# Patient Record
Sex: Male | Born: 1985 | Hispanic: No | Marital: Single | Smoking: Current every day smoker
Health system: Southern US, Community
[De-identification: ages and names within clinical notes are randomized; demographics above are authoritative.]

---

## 2010-09-11 ENCOUNTER — Emergency Department (HOSPITAL_COMMUNITY)
Admission: EM | Admit: 2010-09-11 | Discharge: 2010-09-11 | Disposition: A | Discharge status: Home / Self Care | Payer: Self-pay | Attending: Emergency Medicine | Admitting: Emergency Medicine

## 2010-09-11 DIAGNOSIS — F1210 Cannabis abuse, uncomplicated: Secondary | ICD-10-CM | POA: Insufficient documentation

## 2010-09-11 DIAGNOSIS — R110 Nausea: Secondary | ICD-10-CM | POA: Insufficient documentation

## 2010-09-11 DIAGNOSIS — T391X1A Poisoning by 4-Aminophenol derivatives, accidental (unintentional), initial encounter: Secondary | ICD-10-CM | POA: Insufficient documentation

## 2010-09-11 DIAGNOSIS — R000 Tachycardia, unspecified: Secondary | ICD-10-CM | POA: Insufficient documentation

## 2010-09-11 DIAGNOSIS — T398X2A Poisoning by other nonopioid analgesics and antipyretics, not elsewhere classified, intentional self-harm, initial encounter: Secondary | ICD-10-CM | POA: Insufficient documentation

## 2010-09-11 DIAGNOSIS — Z139 Encounter for screening, unspecified: Secondary | ICD-10-CM | POA: Insufficient documentation

## 2010-09-11 DIAGNOSIS — T394X2A Poisoning by antirheumatics, not elsewhere classified, intentional self-harm, initial encounter: Secondary | ICD-10-CM | POA: Insufficient documentation

## 2010-09-11 DIAGNOSIS — F17200 Nicotine dependence, unspecified, uncomplicated: Secondary | ICD-10-CM | POA: Insufficient documentation

## 2010-09-11 LAB — RAPID URINE DRUG SCREEN, HOSP PERFORMED
Amphetamines: NOT DETECTED
Barbiturates: NOT DETECTED
Benzodiazepines: NOT DETECTED
Cocaine: NOT DETECTED
Opiates: NOT DETECTED
Tetrahydrocannabinol: POSITIVE — AB

## 2010-09-11 LAB — DIFFERENTIAL
Basophils Absolute: 0.1 K/uL (ref 0.0–0.1)
Basophils Relative: 1 % (ref 0–1)
Eosinophils Absolute: 0.3 K/uL (ref 0.0–0.7)
Eosinophils Relative: 3 % (ref 0–5)
Lymphocytes Relative: 38 % (ref 12–46)
Lymphs Abs: 3.5 10*3/uL (ref 0.7–4.0)
Monocytes Absolute: 1 K/uL (ref 0.1–1.0)
Monocytes Relative: 11 % (ref 3–12)
Neutro Abs: 4.4 10*3/uL (ref 1.7–7.7)
Neutrophils Relative %: 47 % (ref 43–77)

## 2010-09-11 LAB — CBC
HCT: 40.2 % (ref 39.0–52.0)
Hemoglobin: 13.1 g/dL (ref 13.0–17.0)
MCH: 20 pg — ABNORMAL LOW (ref 26.0–34.0)
MCHC: 32.6 g/dL (ref 30.0–36.0)
MCV: 61.5 fL — ABNORMAL LOW (ref 78.0–100.0)
Platelets: 203 10*3/uL (ref 150–400)
RBC: 6.54 MIL/uL — ABNORMAL HIGH (ref 4.22–5.81)
RDW: 16.7 % — ABNORMAL HIGH (ref 11.5–15.5)
WBC: 9.3 10*3/uL (ref 4.0–10.5)

## 2010-09-11 LAB — SALICYLATE LEVEL: Salicylate Lvl: <4 mg/dL (ref 2.8–20.0)

## 2010-09-11 LAB — BASIC METABOLIC PANEL
CO2: 27 mEq/L (ref 19–32)
Calcium: 9.5 mg/dL (ref 8.4–10.5)
Chloride: 105 mEq/L (ref 96–112)
GFR calc Af Amer: >60 mL/min (ref >60)
Potassium: 3.9 mEq/L (ref 3.5–5.1)
Sodium: 139 mEq/L (ref 135–145)

## 2010-09-11 LAB — BASIC METABOLIC PANEL WITH GFR
BUN: 11 mg/dL (ref 6–23)
Creatinine, Ser: 0.81 mg/dL (ref 0.4–1.5)
GFR calc non Af Amer: >60 mL/min (ref >60)
Glucose, Bld: 89 mg/dL (ref 70–99)

## 2010-09-11 LAB — ACETAMINOPHEN LEVEL: Acetaminophen (Tylenol), Serum: <10 ug/mL — ABNORMAL LOW (ref 10–30)

## 2010-09-11 LAB — ETHANOL: Alcohol, Ethyl (B): <5 mg/dL (ref 0–10)

## 2011-03-20 ENCOUNTER — Emergency Department (HOSPITAL_COMMUNITY): Payer: Self-pay

## 2011-03-20 ENCOUNTER — Inpatient Hospital Stay (HOSPITAL_COMMUNITY)
Admission: EM | Admit: 2011-03-20 | Discharge: 2011-03-28 | DRG: 956 | Disposition: A | Discharge status: Home Health | Payer: No Typology Code available for payment source | Attending: General Surgery | Admitting: General Surgery

## 2011-03-20 ENCOUNTER — Emergency Department (HOSPITAL_COMMUNITY): Payer: No Typology Code available for payment source

## 2011-03-20 ENCOUNTER — Encounter (HOSPITAL_COMMUNITY): Payer: Self-pay | Admitting: Radiology

## 2011-03-20 DIAGNOSIS — F1210 Cannabis abuse, uncomplicated: Secondary | ICD-10-CM | POA: Diagnosis present

## 2011-03-20 DIAGNOSIS — S62639A Displaced fracture of distal phalanx of unspecified finger, initial encounter for closed fracture: Secondary | ICD-10-CM | POA: Diagnosis present

## 2011-03-20 DIAGNOSIS — F17200 Nicotine dependence, unspecified, uncomplicated: Secondary | ICD-10-CM | POA: Diagnosis present

## 2011-03-20 DIAGNOSIS — S62109A Fracture of unspecified carpal bone, unspecified wrist, initial encounter for closed fracture: Secondary | ICD-10-CM

## 2011-03-20 DIAGNOSIS — S32409A Unspecified fracture of unspecified acetabulum, initial encounter for closed fracture: Secondary | ICD-10-CM | POA: Diagnosis present

## 2011-03-20 DIAGNOSIS — M549 Dorsalgia, unspecified: Secondary | ICD-10-CM | POA: Diagnosis present

## 2011-03-20 DIAGNOSIS — L989 Disorder of the skin and subcutaneous tissue, unspecified: Secondary | ICD-10-CM | POA: Diagnosis present

## 2011-03-20 DIAGNOSIS — E876 Hypokalemia: Secondary | ICD-10-CM | POA: Diagnosis not present

## 2011-03-20 DIAGNOSIS — M25579 Pain in unspecified ankle and joints of unspecified foot: Secondary | ICD-10-CM | POA: Diagnosis present

## 2011-03-20 DIAGNOSIS — Y998 Other external cause status: Secondary | ICD-10-CM

## 2011-03-20 DIAGNOSIS — R339 Retention of urine, unspecified: Secondary | ICD-10-CM | POA: Diagnosis not present

## 2011-03-20 DIAGNOSIS — S060X9A Concussion with loss of consciousness of unspecified duration, initial encounter: Secondary | ICD-10-CM | POA: Diagnosis present

## 2011-03-20 DIAGNOSIS — M25569 Pain in unspecified knee: Secondary | ICD-10-CM | POA: Diagnosis present

## 2011-03-20 DIAGNOSIS — D62 Acute posthemorrhagic anemia: Secondary | ICD-10-CM | POA: Diagnosis present

## 2011-03-20 DIAGNOSIS — Y92410 Unspecified street and highway as the place of occurrence of the external cause: Secondary | ICD-10-CM

## 2011-03-20 DIAGNOSIS — R079 Chest pain, unspecified: Secondary | ICD-10-CM | POA: Diagnosis present

## 2011-03-20 DIAGNOSIS — S62113A Displaced fracture of triquetrum [cuneiform] bone, unspecified wrist, initial encounter for closed fracture: Secondary | ICD-10-CM | POA: Diagnosis present

## 2011-03-20 DIAGNOSIS — S72033A Displaced midcervical fracture of unspecified femur, initial encounter for closed fracture: Principal | ICD-10-CM | POA: Diagnosis present

## 2011-03-20 LAB — CBC
HCT: 36.4 % — ABNORMAL LOW (ref 39.0–52.0)
Hemoglobin: 11.7 g/dL — ABNORMAL LOW (ref 13.0–17.0)
MCH: 19.6 pg — ABNORMAL LOW (ref 26.0–34.0)
MCHC: 32.1 g/dL (ref 30.0–36.0)
Platelets: 175 10*3/uL (ref 150–400)
RBC: 5.68 MIL/uL (ref 4.22–5.81)
WBC: 20.3 10*3/uL — ABNORMAL HIGH (ref 4.0–10.5)

## 2011-03-20 LAB — COMPREHENSIVE METABOLIC PANEL
ALT: 64 U/L — ABNORMAL HIGH (ref 0–53)
Calcium: 8.7 mg/dL (ref 8.4–10.5)
GFR calc Af Amer: >60 mL/min (ref >60)
Glucose, Bld: 126 mg/dL — ABNORMAL HIGH (ref 70–99)
Sodium: 137 mEq/L (ref 135–145)
Total Protein: 6.4 g/dL (ref 6.0–8.3)

## 2011-03-20 LAB — HEMOGLOBIN AND HEMATOCRIT, BLOOD: Hemoglobin: 11.1 g/dL — ABNORMAL LOW (ref 13.0–17.0)

## 2011-03-20 LAB — GLUCOSE, CAPILLARY: Glucose-Capillary: 112 mg/dL — ABNORMAL HIGH (ref 70–99)

## 2011-03-20 LAB — POCT I-STAT, CHEM 8
Creatinine, Ser: 0.7 mg/dL (ref 0.50–1.35)
Glucose, Bld: 121 mg/dL — ABNORMAL HIGH (ref 70–99)
Hemoglobin: 13.6 g/dL (ref 13.0–17.0)
Potassium: 3.8 mEq/L (ref 3.5–5.1)

## 2011-03-20 LAB — PROTIME-INR: Prothrombin Time: 14.1 seconds (ref 11.6–15.2)

## 2011-03-20 MED ORDER — IOHEXOL 300 MG/ML  SOLN
100.0000 mL | Freq: Once | INTRAMUSCULAR | Status: AC | PRN
  Administered 2011-03-20: 100 mL via INTRAVENOUS

## 2011-03-21 ENCOUNTER — Inpatient Hospital Stay (HOSPITAL_COMMUNITY): Payer: No Typology Code available for payment source

## 2011-03-21 LAB — BASIC METABOLIC PANEL
BUN: 6 mg/dL (ref 6–23)
CO2: 28 mEq/L (ref 19–32)
Chloride: 101 mEq/L (ref 96–112)
Creatinine, Ser: 0.51 mg/dL (ref 0.50–1.35)

## 2011-03-21 LAB — RAPID URINE DRUG SCREEN, HOSP PERFORMED
Cocaine: NOT DETECTED
Opiates: NOT DETECTED

## 2011-03-21 LAB — CBC
HCT: 30.6 % — ABNORMAL LOW (ref 39.0–52.0)
Hemoglobin: 9.9 g/dL — ABNORMAL LOW (ref 13.0–17.0)
MCV: 61.3 fL — ABNORMAL LOW (ref 78.0–100.0)
RBC: 4.99 MIL/uL (ref 4.22–5.81)
WBC: 15.2 10*3/uL — ABNORMAL HIGH (ref 4.0–10.5)

## 2011-03-21 LAB — HEMOGLOBIN AND HEMATOCRIT, BLOOD
HCT: 28.2 % — ABNORMAL LOW (ref 39.0–52.0)
Hemoglobin: 9.7 g/dL — ABNORMAL LOW (ref 13.0–17.0)

## 2011-03-22 ENCOUNTER — Inpatient Hospital Stay (HOSPITAL_COMMUNITY): Payer: No Typology Code available for payment source

## 2011-03-22 HISTORY — PX: HIP SURGERY: SHX245

## 2011-03-22 LAB — BASIC METABOLIC PANEL
BUN: 13 mg/dL (ref 6–23)
Chloride: 102 mEq/L (ref 96–112)
GFR calc Af Amer: >90 mL/min (ref >90)
Potassium: 3.6 mEq/L (ref 3.5–5.1)

## 2011-03-22 LAB — CBC
HCT: 24.9 % — ABNORMAL LOW (ref 39.0–52.0)
HCT: 35 % — ABNORMAL LOW (ref 39.0–52.0)
Hemoglobin: 8.1 g/dL — ABNORMAL LOW (ref 13.0–17.0)
Hemoglobin: 12.2 g/dL — ABNORMAL LOW (ref 13.0–17.0)
MCH: 25.3 pg — ABNORMAL LOW (ref 26.0–34.0)
MCHC: 34.9 g/dL (ref 30.0–36.0)
RDW: 16.6 % — ABNORMAL HIGH (ref 11.5–15.5)
RDW: 23.1 % — ABNORMAL HIGH (ref 11.5–15.5)
WBC: 12.3 10*3/uL — ABNORMAL HIGH (ref 4.0–10.5)

## 2011-03-23 LAB — PREPARE FRESH FROZEN PLASMA: Unit division: 0

## 2011-03-23 LAB — BASIC METABOLIC PANEL
BUN: 7 mg/dL (ref 6–23)
CO2: 31 mEq/L (ref 19–32)
Calcium: 7.6 mg/dL — ABNORMAL LOW (ref 8.4–10.5)
Creatinine, Ser: 0.5 mg/dL (ref 0.50–1.35)
Glucose, Bld: 193 mg/dL — ABNORMAL HIGH (ref 70–99)

## 2011-03-23 LAB — CBC
MCH: 25.3 pg — ABNORMAL LOW (ref 26.0–34.0)
MCHC: 35.2 g/dL (ref 30.0–36.0)
MCV: 71.9 fL — ABNORMAL LOW (ref 78.0–100.0)
Platelets: 94 10*3/uL — ABNORMAL LOW (ref 150–400)
RDW: 22.5 % — ABNORMAL HIGH (ref 11.5–15.5)

## 2011-03-23 LAB — PROTIME-INR: Prothrombin Time: 16.2 seconds — ABNORMAL HIGH (ref 11.6–15.2)
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
## 2011-03-24 DIAGNOSIS — S72023A Displaced fracture of epiphysis (separation) (upper) of unspecified femur, initial encounter for closed fracture: Secondary | ICD-10-CM

## 2011-03-24 DIAGNOSIS — S92919A Unspecified fracture of unspecified toe(s), initial encounter for closed fracture: Secondary | ICD-10-CM

## 2011-03-24 DIAGNOSIS — S32409A Unspecified fracture of unspecified acetabulum, initial encounter for closed fracture: Secondary | ICD-10-CM

## 2011-03-24 DIAGNOSIS — S62109A Fracture of unspecified carpal bone, unspecified wrist, initial encounter for closed fracture: Secondary | ICD-10-CM

## 2011-03-24 LAB — MAGNESIUM: Magnesium: 1.9 mg/dL (ref 1.5–2.5)

## 2011-03-24 LAB — CBC
MCH: 25.1 pg — ABNORMAL LOW (ref 26.0–34.0)
MCHC: 34.7 g/dL (ref 30.0–36.0)
MCV: 72.5 fL — ABNORMAL LOW (ref 78.0–100.0)
Platelets: 135 10*3/uL — ABNORMAL LOW (ref 150–400)
RBC: 3.74 MIL/uL — ABNORMAL LOW (ref 4.22–5.81)
RDW: 23.6 % — ABNORMAL HIGH (ref 11.5–15.5)

## 2011-03-24 LAB — BASIC METABOLIC PANEL
CO2: 30 mEq/L (ref 19–32)
Calcium: 8.1 mg/dL — ABNORMAL LOW (ref 8.4–10.5)
Creatinine, Ser: 0.52 mg/dL (ref 0.50–1.35)
GFR calc non Af Amer: >90 mL/min (ref >90)

## 2011-03-24 LAB — POCT I-STAT 4, (NA,K, GLUC, HGB,HCT)
Glucose, Bld: 105 mg/dL — ABNORMAL HIGH (ref 70–99)
HCT: 25 % — ABNORMAL LOW (ref 39.0–52.0)
Hemoglobin: 8.8 g/dL — ABNORMAL LOW (ref 13.0–17.0)
Hemoglobin: 8.5 g/dL — ABNORMAL LOW (ref 13.0–17.0)
Potassium: 4.4 meq/L (ref 3.5–5.1)
Sodium: 134 meq/L — ABNORMAL LOW (ref 135–145)

## 2011-03-25 LAB — CARDIAC PANEL(CRET KIN+CKTOT+MB+TROPI)
CK, MB: 1.5 ng/mL (ref 0.3–4.0)
Total CK: 371 U/L — ABNORMAL HIGH (ref 7–232)

## 2011-03-25 LAB — PROTIME-INR: Prothrombin Time: 14.5 seconds (ref 11.6–15.2)

## 2011-03-26 LAB — CROSSMATCH
ABO/RH(D): B POS
Antibody Screen: NEGATIVE
Unit division: 0
Unit division: 0
Unit division: 0
Unit division: 0
Unit division: 0

## 2011-03-26 LAB — CBC
MCH: 24.9 pg — ABNORMAL LOW (ref 26.0–34.0)
MCV: 73 fL — ABNORMAL LOW (ref 78.0–100.0)
Platelets: 182 10*3/uL (ref 150–400)
WBC: 7.5 10*3/uL (ref 4.0–10.5)
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
## 2011-05-03 ENCOUNTER — Emergency Department (HOSPITAL_COMMUNITY): Payer: No Typology Code available for payment source

## 2011-05-03 ENCOUNTER — Encounter (HOSPITAL_COMMUNITY): Payer: Self-pay

## 2011-05-03 ENCOUNTER — Emergency Department (HOSPITAL_COMMUNITY)
Admission: EM | Admit: 2011-05-03 | Discharge: 2011-05-04 | Disposition: A | Discharge status: Home / Self Care | Payer: No Typology Code available for payment source | Attending: Emergency Medicine | Admitting: Emergency Medicine

## 2011-05-03 DIAGNOSIS — S79929A Unspecified injury of unspecified thigh, initial encounter: Secondary | ICD-10-CM | POA: Insufficient documentation

## 2011-05-03 DIAGNOSIS — S8990XA Unspecified injury of unspecified lower leg, initial encounter: Secondary | ICD-10-CM | POA: Insufficient documentation

## 2011-05-03 DIAGNOSIS — S79919A Unspecified injury of unspecified hip, initial encounter: Secondary | ICD-10-CM | POA: Insufficient documentation

## 2011-05-03 DIAGNOSIS — S99929A Unspecified injury of unspecified foot, initial encounter: Secondary | ICD-10-CM | POA: Insufficient documentation

## 2011-05-03 DIAGNOSIS — W010XXA Fall on same level from slipping, tripping and stumbling without subsequent striking against object, initial encounter: Secondary | ICD-10-CM | POA: Insufficient documentation

## 2011-05-03 DIAGNOSIS — M25559 Pain in unspecified hip: Secondary | ICD-10-CM | POA: Insufficient documentation

## 2011-05-03 DIAGNOSIS — M79609 Pain in unspecified limb: Secondary | ICD-10-CM | POA: Insufficient documentation

## 2011-05-03 MED ORDER — HYDROMORPHONE HCL PF 2 MG/ML IJ SOLN
2.0000 mg | Freq: Once | INTRAMUSCULAR | Status: AC
  Administered 2011-05-03: 2 mg via INTRAMUSCULAR
  Filled 2011-05-03: qty 1

## 2011-05-03 MED ORDER — HYDROMORPHONE HCL PF 2 MG/ML IJ SOLN: 2.0000 mg | Freq: Once | INTRAMUSCULAR | Status: DC

## 2011-05-03 NOTE — ED Notes (Signed)
Patient presents with left hip pain today after tripping and falling. Patient had recent left hip surgery 1 month ago.  Patient reports he cannot put weight on left leg.  It's hard to tell if shortening is present because patient will not move his leg.  Skin is intact. Left ankle swelling noted, patient moves ankle and foot without difficulty.
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
## 2011-05-03 NOTE — ED Notes (Signed)
PT to xray

## 2011-05-04 MED ORDER — HYDROMORPHONE HCL 4 MG PO TABS: 4.0000 mg | ORAL_TABLET | ORAL | Status: AC | PRN

## 2011-06-16 ENCOUNTER — Ambulatory Visit: Payer: No Typology Code available for payment source | Attending: Orthopedic Surgery | Admitting: Physical Therapy

## 2011-06-16 DIAGNOSIS — M25673 Stiffness of unspecified ankle, not elsewhere classified: Secondary | ICD-10-CM | POA: Insufficient documentation

## 2011-06-16 DIAGNOSIS — M25659 Stiffness of unspecified hip, not elsewhere classified: Secondary | ICD-10-CM | POA: Insufficient documentation

## 2011-06-16 DIAGNOSIS — IMO0001 Reserved for inherently not codable concepts without codable children: Secondary | ICD-10-CM | POA: Insufficient documentation

## 2011-06-16 DIAGNOSIS — M25676 Stiffness of unspecified foot, not elsewhere classified: Secondary | ICD-10-CM | POA: Insufficient documentation

## 2011-06-16 DIAGNOSIS — R262 Difficulty in walking, not elsewhere classified: Secondary | ICD-10-CM | POA: Insufficient documentation

## 2011-06-16 DIAGNOSIS — M6281 Muscle weakness (generalized): Secondary | ICD-10-CM | POA: Insufficient documentation

## 2011-06-24 ENCOUNTER — Ambulatory Visit: Payer: No Typology Code available for payment source | Attending: Orthopedic Surgery | Admitting: Rehabilitation

## 2011-06-24 DIAGNOSIS — M25676 Stiffness of unspecified foot, not elsewhere classified: Secondary | ICD-10-CM | POA: Insufficient documentation

## 2011-06-24 DIAGNOSIS — M6281 Muscle weakness (generalized): Secondary | ICD-10-CM | POA: Insufficient documentation

## 2011-06-24 DIAGNOSIS — M25659 Stiffness of unspecified hip, not elsewhere classified: Secondary | ICD-10-CM | POA: Insufficient documentation

## 2011-06-24 DIAGNOSIS — M25673 Stiffness of unspecified ankle, not elsewhere classified: Secondary | ICD-10-CM | POA: Insufficient documentation

## 2011-06-24 DIAGNOSIS — R262 Difficulty in walking, not elsewhere classified: Secondary | ICD-10-CM | POA: Insufficient documentation

## 2011-06-24 DIAGNOSIS — IMO0001 Reserved for inherently not codable concepts without codable children: Secondary | ICD-10-CM | POA: Insufficient documentation

## 2011-06-28 ENCOUNTER — Ambulatory Visit: Payer: No Typology Code available for payment source | Admitting: Physical Therapy

## 2011-06-30 ENCOUNTER — Ambulatory Visit: Payer: No Typology Code available for payment source | Admitting: Physical Therapy

## 2011-07-05 ENCOUNTER — Ambulatory Visit: Payer: No Typology Code available for payment source | Admitting: Rehabilitation

## 2011-07-07 ENCOUNTER — Ambulatory Visit: Payer: No Typology Code available for payment source | Admitting: Physical Therapy

## 2011-07-12 ENCOUNTER — Encounter: Payer: No Typology Code available for payment source | Admitting: Rehabilitation

## 2011-07-14 ENCOUNTER — Ambulatory Visit: Payer: No Typology Code available for payment source | Admitting: Physical Therapy

## 2011-07-15 ENCOUNTER — Ambulatory Visit: Payer: No Typology Code available for payment source | Admitting: Physical Therapy

## 2011-07-20 ENCOUNTER — Ambulatory Visit: Payer: No Typology Code available for payment source | Admitting: Physical Therapy

## 2011-07-21 ENCOUNTER — Ambulatory Visit: Payer: No Typology Code available for payment source | Admitting: Physical Therapy

## 2011-07-23 ENCOUNTER — Ambulatory Visit: Payer: No Typology Code available for payment source | Attending: Orthopedic Surgery | Admitting: Physical Therapy

## 2011-07-23 DIAGNOSIS — IMO0001 Reserved for inherently not codable concepts without codable children: Secondary | ICD-10-CM | POA: Insufficient documentation

## 2011-07-23 DIAGNOSIS — M25676 Stiffness of unspecified foot, not elsewhere classified: Secondary | ICD-10-CM | POA: Insufficient documentation

## 2011-07-23 DIAGNOSIS — M6281 Muscle weakness (generalized): Secondary | ICD-10-CM | POA: Insufficient documentation

## 2011-07-23 DIAGNOSIS — M25659 Stiffness of unspecified hip, not elsewhere classified: Secondary | ICD-10-CM | POA: Insufficient documentation

## 2011-07-23 DIAGNOSIS — M25673 Stiffness of unspecified ankle, not elsewhere classified: Secondary | ICD-10-CM | POA: Insufficient documentation

## 2011-07-23 DIAGNOSIS — R262 Difficulty in walking, not elsewhere classified: Secondary | ICD-10-CM | POA: Insufficient documentation

## 2011-07-27 ENCOUNTER — Ambulatory Visit: Payer: No Typology Code available for payment source | Admitting: Physical Therapy

## 2011-07-28 ENCOUNTER — Ambulatory Visit: Payer: No Typology Code available for payment source | Admitting: Physical Therapy

## 2011-07-30 ENCOUNTER — Ambulatory Visit: Payer: No Typology Code available for payment source | Admitting: Physical Therapy

## 2011-08-04 ENCOUNTER — Ambulatory Visit: Payer: No Typology Code available for payment source | Admitting: Physical Therapy

## 2011-08-06 ENCOUNTER — Ambulatory Visit: Payer: No Typology Code available for payment source | Admitting: Rehabilitation

## 2011-08-09 ENCOUNTER — Ambulatory Visit: Payer: No Typology Code available for payment source | Admitting: Rehabilitation

## 2011-08-11 ENCOUNTER — Ambulatory Visit: Payer: No Typology Code available for payment source | Admitting: Physical Therapy

## 2011-08-13 ENCOUNTER — Ambulatory Visit: Payer: No Typology Code available for payment source | Admitting: Physical Therapy

## 2011-08-16 ENCOUNTER — Ambulatory Visit: Payer: No Typology Code available for payment source | Admitting: Rehabilitation

## 2011-08-19 ENCOUNTER — Ambulatory Visit: Payer: No Typology Code available for payment source | Admitting: Rehabilitation

## 2011-08-23 ENCOUNTER — Ambulatory Visit: Payer: No Typology Code available for payment source | Attending: Orthopedic Surgery | Admitting: Rehabilitation

## 2011-08-23 DIAGNOSIS — M25673 Stiffness of unspecified ankle, not elsewhere classified: Secondary | ICD-10-CM | POA: Insufficient documentation

## 2011-08-23 DIAGNOSIS — M25659 Stiffness of unspecified hip, not elsewhere classified: Secondary | ICD-10-CM | POA: Insufficient documentation

## 2011-08-23 DIAGNOSIS — M25676 Stiffness of unspecified foot, not elsewhere classified: Secondary | ICD-10-CM | POA: Insufficient documentation

## 2011-08-23 DIAGNOSIS — R262 Difficulty in walking, not elsewhere classified: Secondary | ICD-10-CM | POA: Insufficient documentation

## 2011-08-23 DIAGNOSIS — IMO0001 Reserved for inherently not codable concepts without codable children: Secondary | ICD-10-CM | POA: Insufficient documentation

## 2011-08-23 DIAGNOSIS — M6281 Muscle weakness (generalized): Secondary | ICD-10-CM | POA: Insufficient documentation

## 2011-08-24 ENCOUNTER — Ambulatory Visit
Admission: RE | Admit: 2011-08-24 | Discharge: 2011-08-24 | Disposition: A | Discharge status: Home / Self Care | Payer: Self-pay | Source: Ambulatory Visit | Attending: Orthopedic Surgery | Admitting: Orthopedic Surgery

## 2011-08-24 ENCOUNTER — Other Ambulatory Visit: Payer: Self-pay | Admitting: Orthopedic Surgery

## 2011-08-24 DIAGNOSIS — M2170 Unequal limb length (acquired), unspecified site: Secondary | ICD-10-CM

## 2011-08-25 ENCOUNTER — Encounter: Payer: No Typology Code available for payment source | Admitting: Rehabilitation

## 2011-08-30 ENCOUNTER — Encounter: Payer: No Typology Code available for payment source | Admitting: Rehabilitation

## 2011-09-01 ENCOUNTER — Encounter: Payer: No Typology Code available for payment source | Admitting: Physical Therapy

## 2011-09-08 ENCOUNTER — Ambulatory Visit: Payer: No Typology Code available for payment source | Admitting: Physical Therapy

## 2011-09-10 ENCOUNTER — Encounter: Payer: No Typology Code available for payment source | Admitting: Rehabilitation

## 2012-09-16 IMAGING — RF DG HIP (WITH OR WITHOUT PELVIS) 2-3V*L*
1 series · 5 of 5 positions shown · non-contrast
Comparison: 03/20/2011.

CLINICAL DATA: Left acetabular ORIF.

LEFT HIP - COMPLETE 2+ VIEW

[Series 1: run · 5 of 5 slices shown]
[im 1/5]
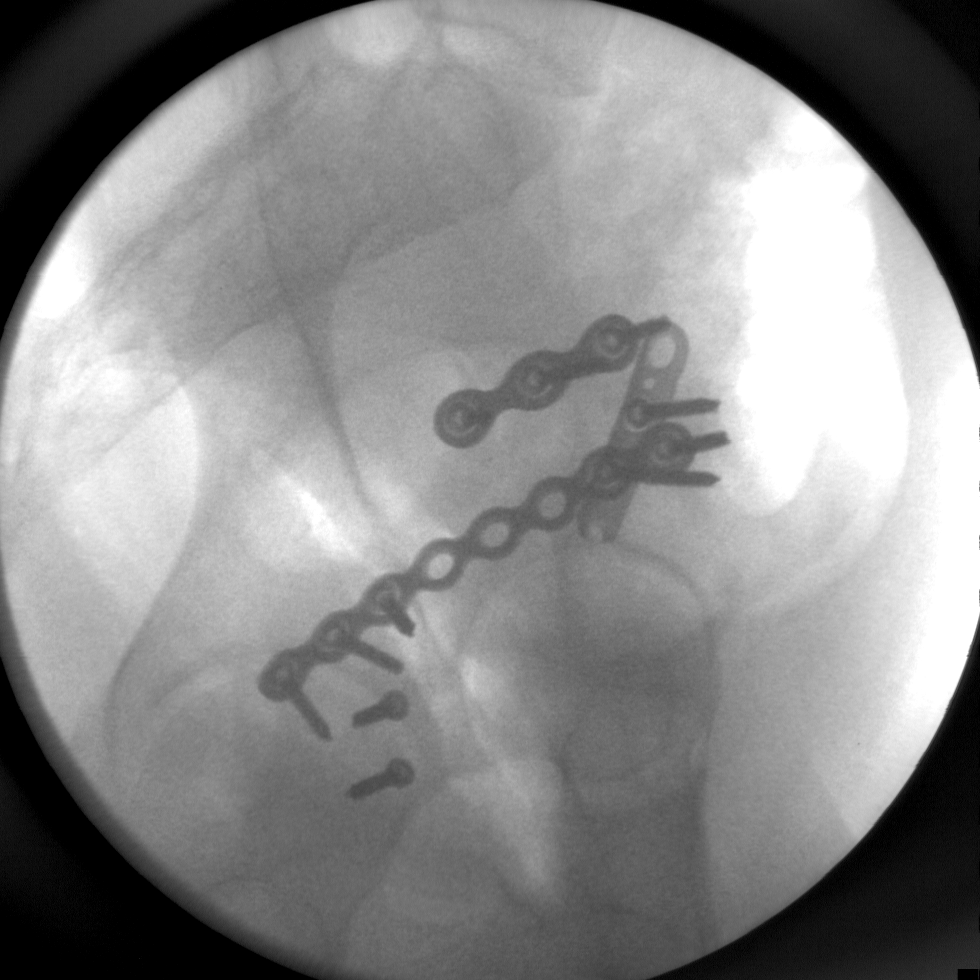
[im 2/5]
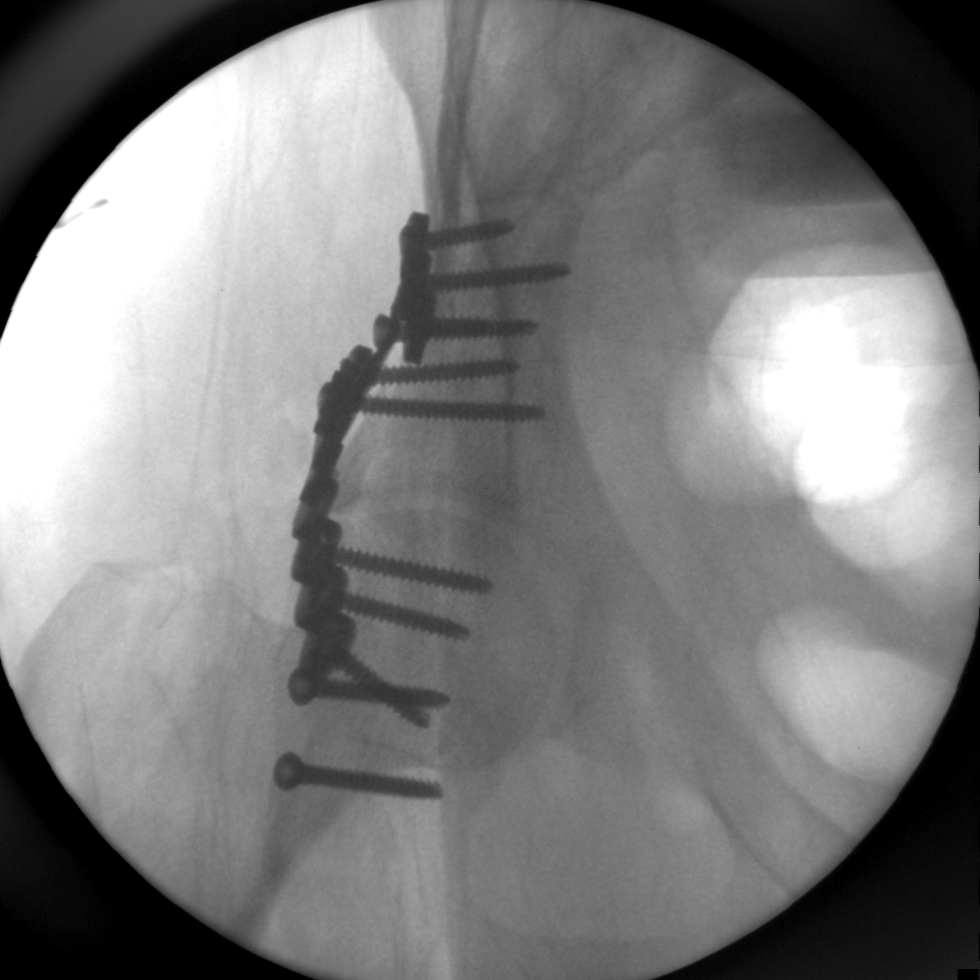
[im 3/5]
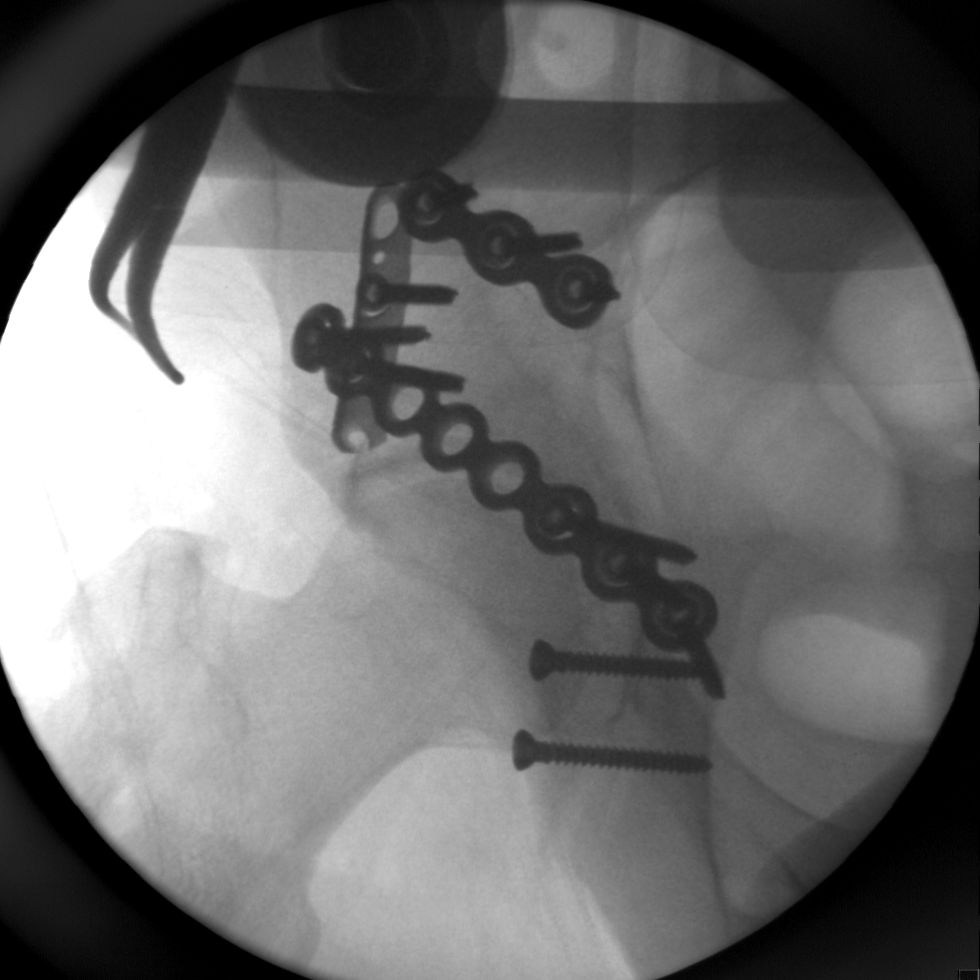
[im 4/5]
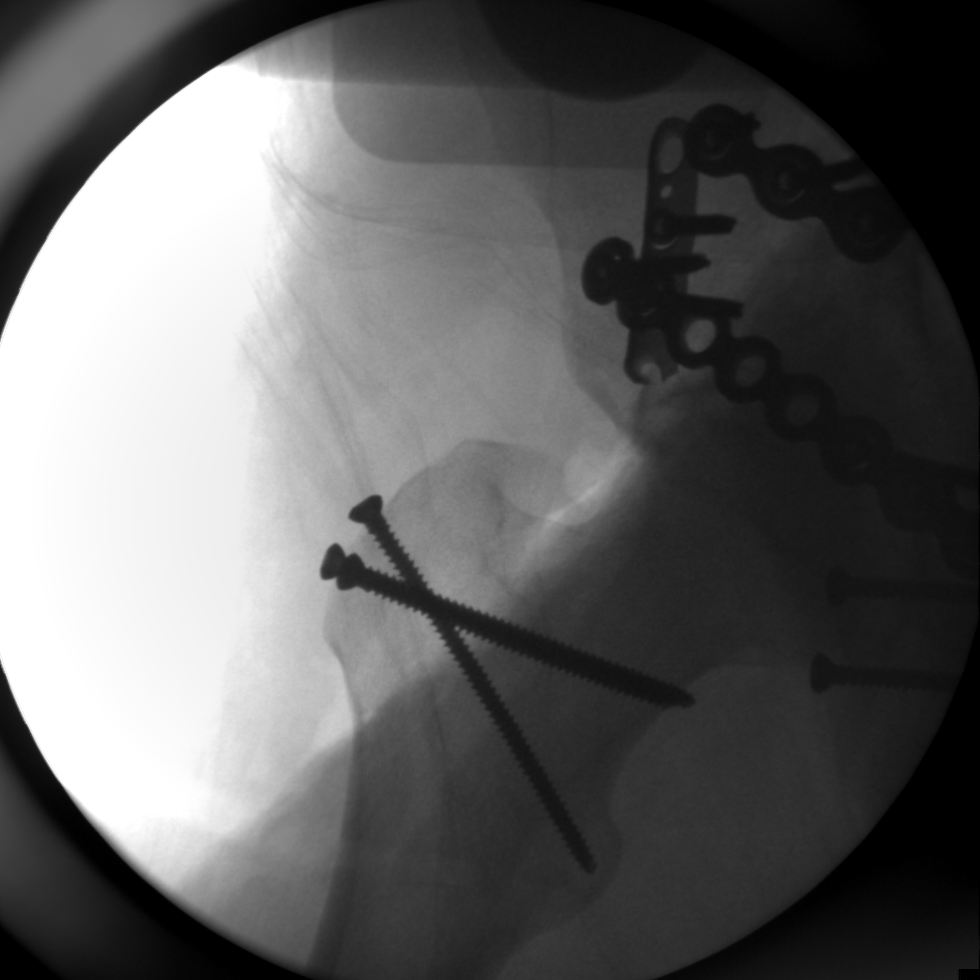
[im 5/5]
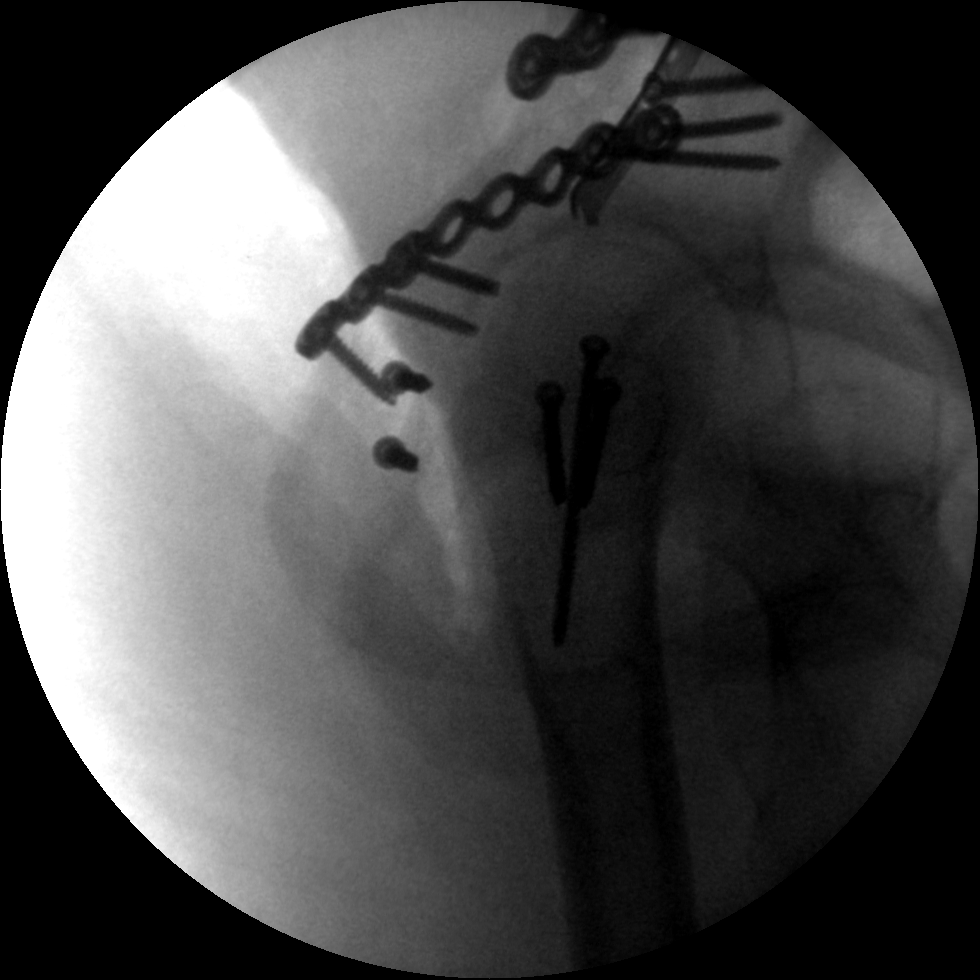

[5 of 5 positions shown; findings below may reference images not displayed]

FINDINGS: Malleable metal plate and screw fixation of left
acetabulum is present.  Screws are also present in the proximal
left femur.  Series of five intraoperative fluoroscopic spot views
are submitted for interpretation.
IMPRESSION: Left acetabular and proximal femur ORIF.

## 2012-09-16 IMAGING — CR DG PELVIS 3+V JUDET
4 series · 4 of 4 positions shown · non-contrast
Comparison: 03/22/2011 and several other previous priors.

CLINICAL DATA: Acetabular fracture, operative fixation

JUDET PELVIS - 3+ VIEW

[AP (1 of 4)]
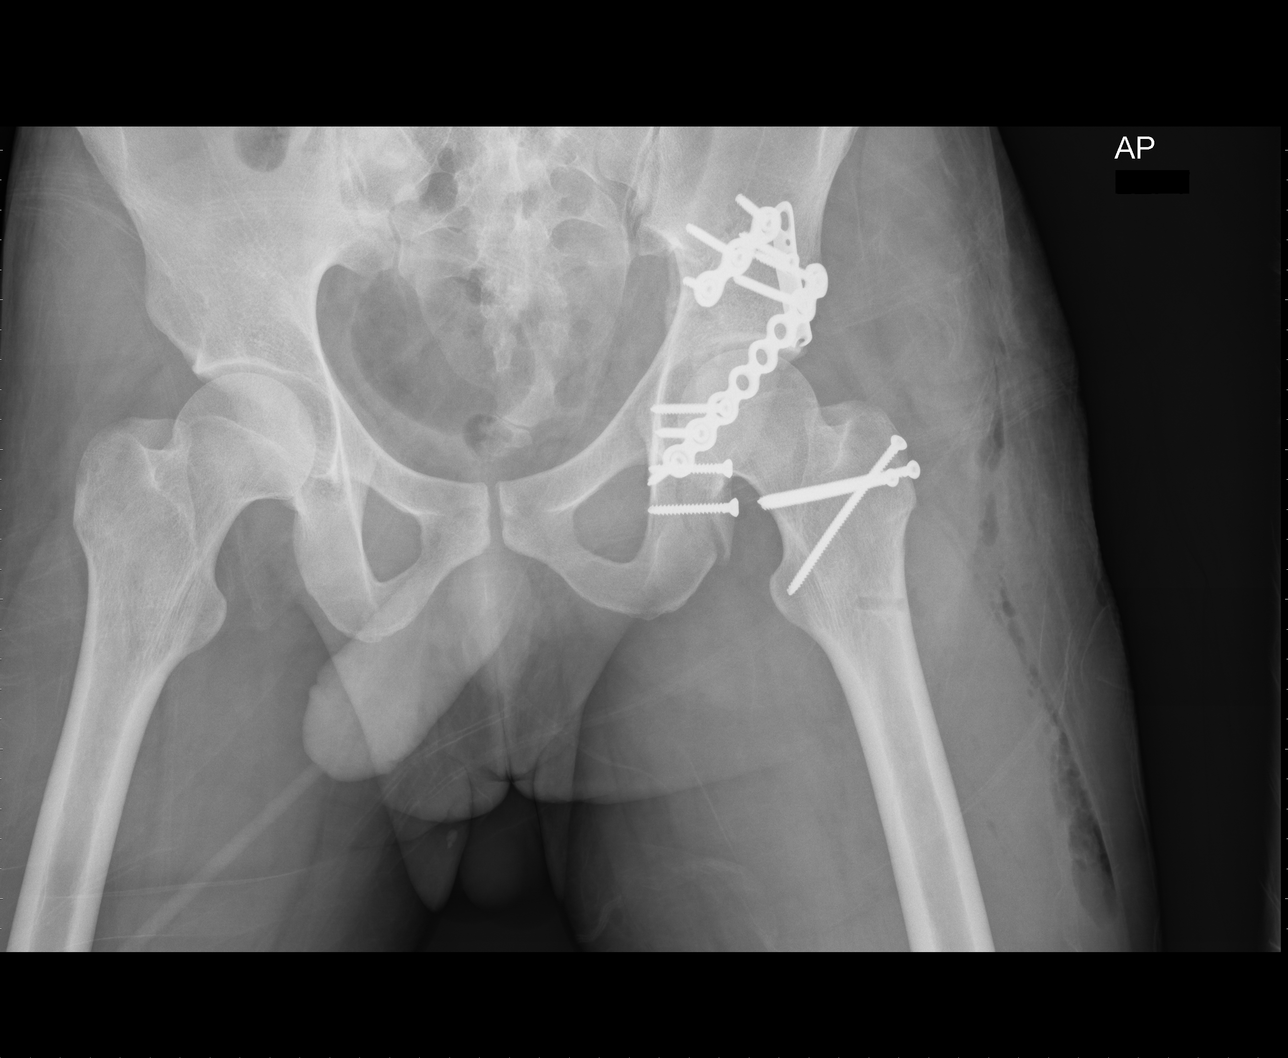

[AP (2 of 4)]
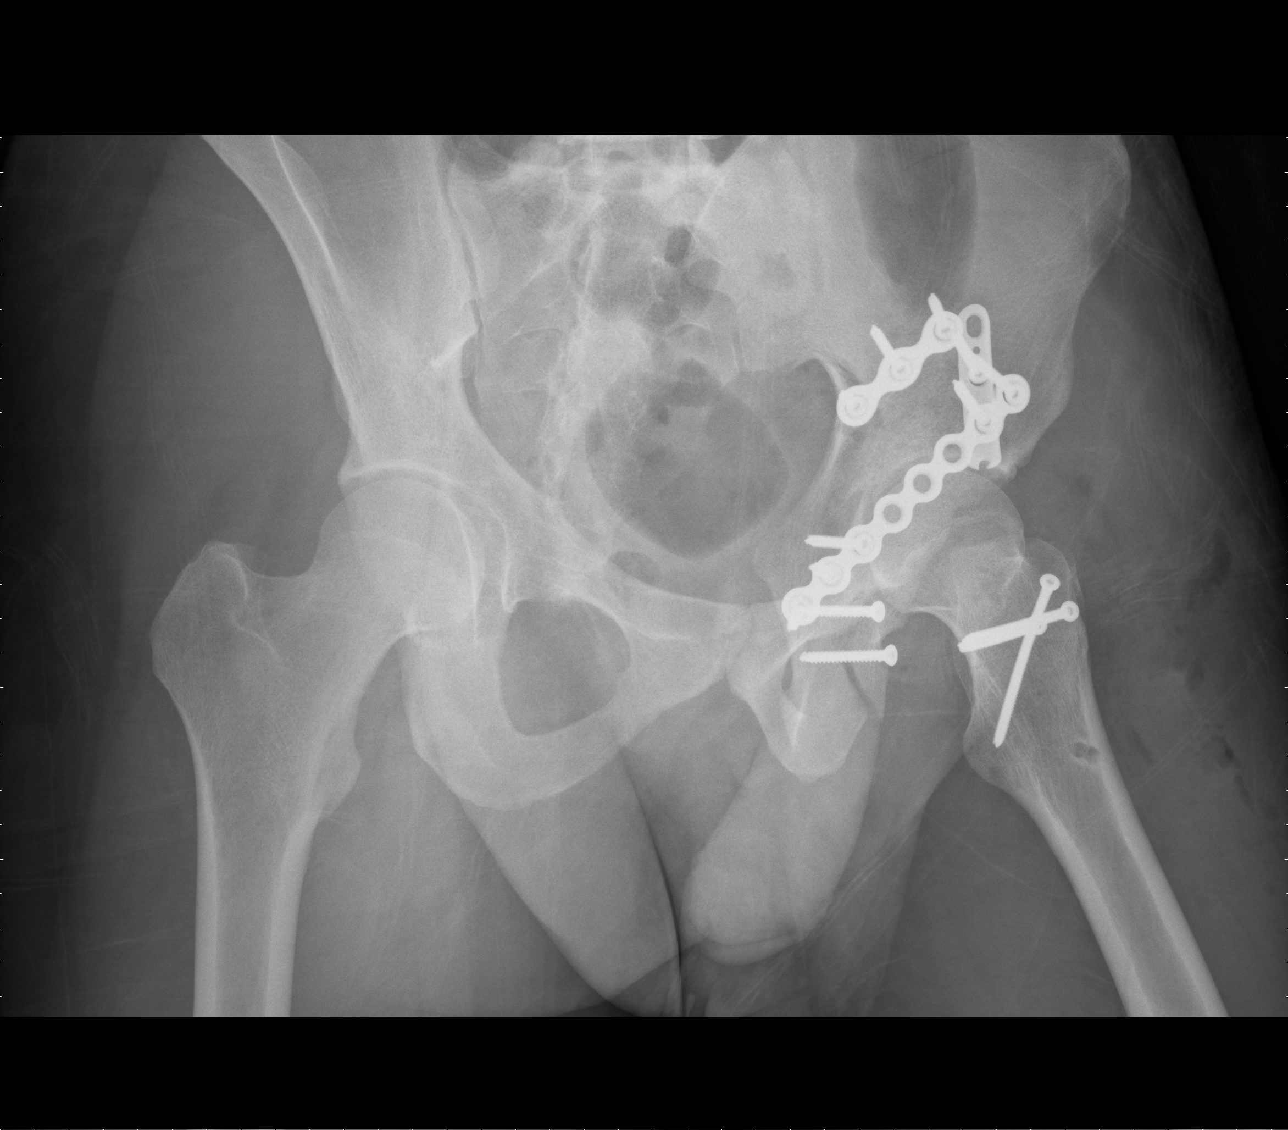

[AP (3 of 4)]
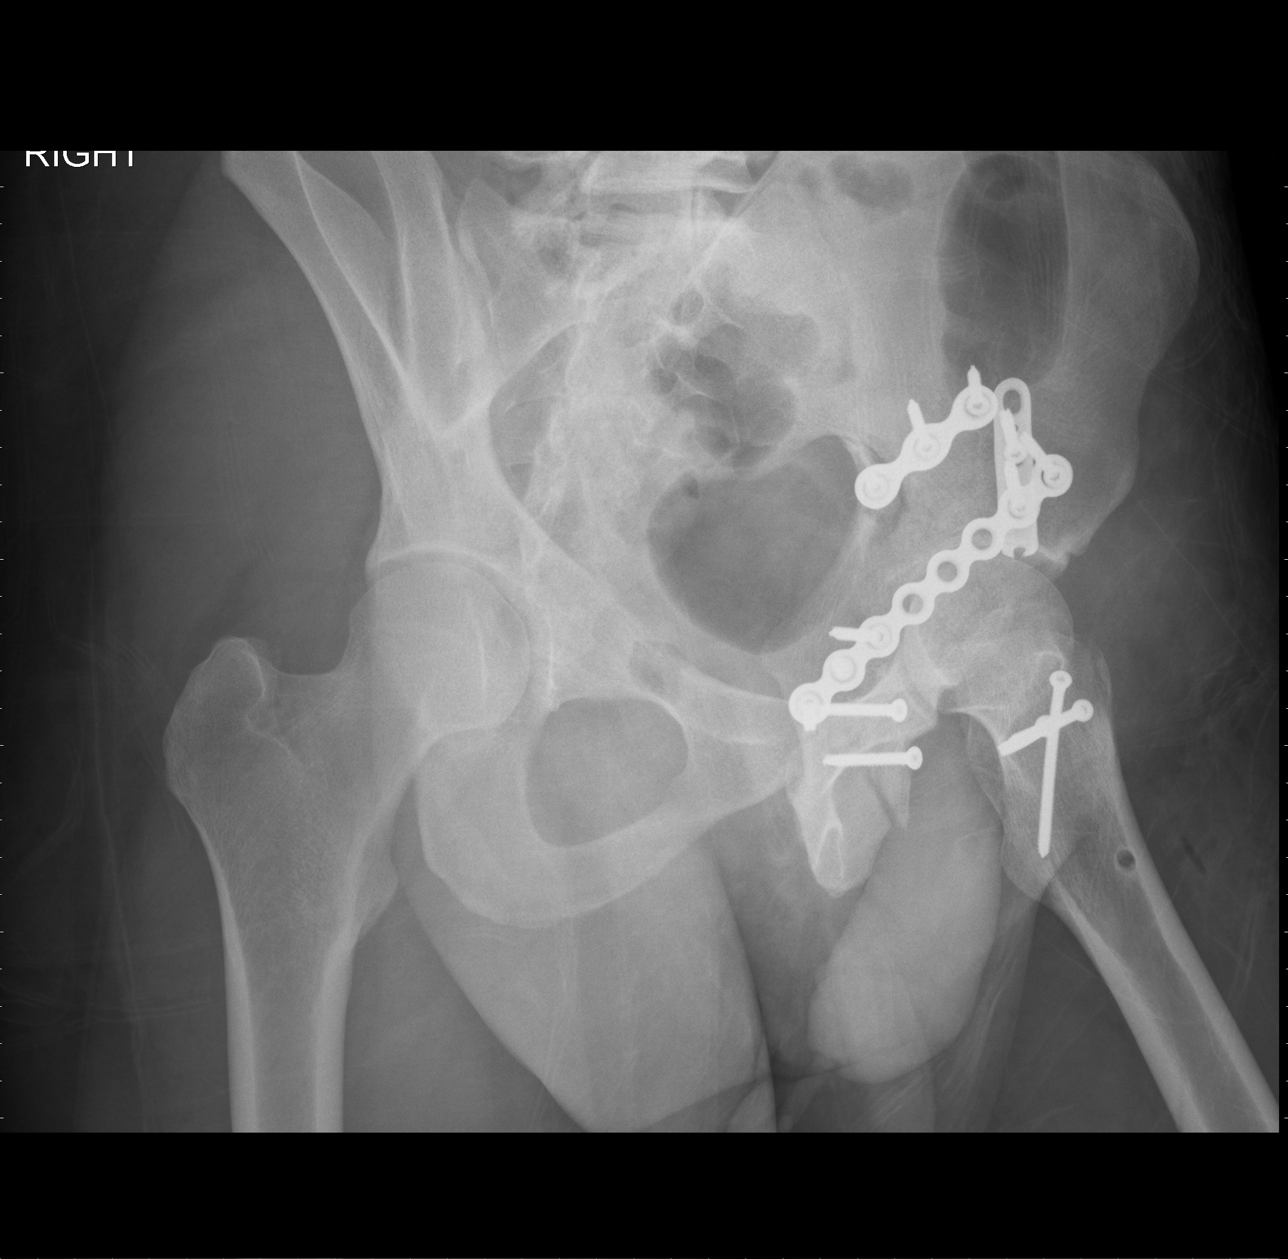

[AP (4 of 4)]
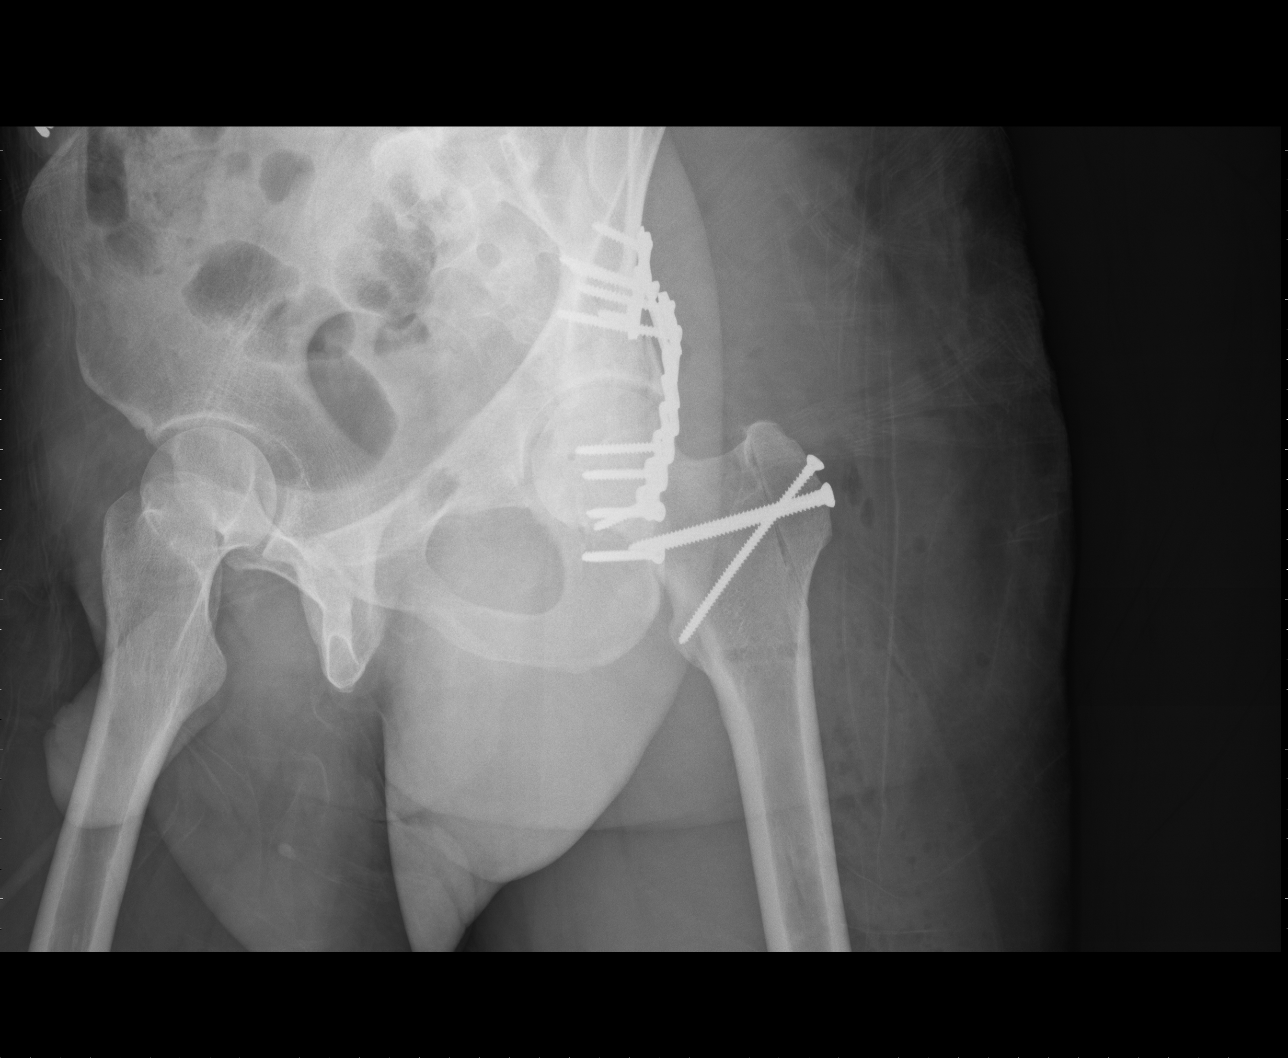

[4 of 4 positions shown; findings below may reference images not displayed]

FINDINGS: Plate screw fixation present of the left hemi pelvis to
reduce the left acetabular fracture.  Fracture lines remain
visualized.  Improved alignment. The left hip appears located.
Trochanteric fixation pins also present.  Right hemi pelvis and hip
unremarkable.  Postop changes of the soft tissues.
IMPRESSION: Status post ORIF of the left acetabular fracture dislocation.
Improved alignment.

## 2014-09-24 ENCOUNTER — Encounter (HOSPITAL_COMMUNITY): Payer: Self-pay | Admitting: Emergency Medicine

## 2014-09-24 ENCOUNTER — Emergency Department (HOSPITAL_COMMUNITY)
Admission: EM | Admit: 2014-09-24 | Discharge: 2014-09-25 | Disposition: A | Discharge status: Home / Self Care | Payer: Self-pay | Attending: Emergency Medicine | Admitting: Emergency Medicine

## 2014-09-24 ENCOUNTER — Emergency Department (HOSPITAL_COMMUNITY): Payer: Self-pay

## 2014-09-24 DIAGNOSIS — Z7901 Long term (current) use of anticoagulants: Secondary | ICD-10-CM | POA: Insufficient documentation

## 2014-09-24 DIAGNOSIS — R002 Palpitations: Secondary | ICD-10-CM | POA: Insufficient documentation

## 2014-09-24 DIAGNOSIS — Z79899 Other long term (current) drug therapy: Secondary | ICD-10-CM | POA: Insufficient documentation

## 2014-09-24 DIAGNOSIS — Z720 Tobacco use: Secondary | ICD-10-CM | POA: Insufficient documentation

## 2014-09-24 DIAGNOSIS — R079 Chest pain, unspecified: Secondary | ICD-10-CM | POA: Insufficient documentation

## 2014-09-24 LAB — BASIC METABOLIC PANEL
Anion gap: 7 (ref 5–15)
BUN: <5 mg/dL — ABNORMAL LOW (ref 6–23)
CHLORIDE: 105 mmol/L (ref 96–112)
CO2: 27 mmol/L (ref 19–32)
Calcium: 9.4 mg/dL (ref 8.4–10.5)
Creatinine, Ser: 0.7 mg/dL (ref 0.50–1.35)
GFR calc Af Amer: >90 mL/min (ref >90)
GLUCOSE: 94 mg/dL (ref 70–99)
POTASSIUM: 3.8 mmol/L (ref 3.5–5.1)
SODIUM: 139 mmol/L (ref 135–145)

## 2014-09-24 LAB — CBC
HCT: 39.5 % (ref 39.0–52.0)
HEMOGLOBIN: 12.3 g/dL — AB (ref 13.0–17.0)
MCH: 19.5 pg — ABNORMAL LOW (ref 26.0–34.0)
MCHC: 31.1 g/dL (ref 30.0–36.0)
MCV: 62.5 fL — ABNORMAL LOW (ref 78.0–100.0)
Platelets: 213 10*3/uL (ref 150–400)
RBC: 6.32 MIL/uL — ABNORMAL HIGH (ref 4.22–5.81)
RDW: 16.4 % — ABNORMAL HIGH (ref 11.5–15.5)
WBC: 5.9 10*3/uL (ref 4.0–10.5)

## 2014-09-24 LAB — I-STAT TROPONIN, ED: TROPONIN I, POC: 0 ng/mL (ref 0.00–0.08)

## 2014-09-24 NOTE — ED Notes (Signed)
Pt taken to radiology

## 2014-09-24 NOTE — ED Provider Notes (Signed)
CSN: [id number]     Arrival date & time 09/24/14  2205 History   First MD Initiated Contact with Patient 09/24/14 2222     Chief Complaint  Patient presents with  . Chest Pain     (Consider location/radiation/quality/duration/timing/severity/associated sxs/prior Treatment) HPI  Curtis Villarreal is a 29 y.o. male presenting with episodes of sharp central chest pain that last for seconds followed by 30 minutes to one of sensation of heart racing. This occurred last couple days. Patient states is worse when he thinks about it and goes away when he distracts himself. No nausea vomiting,  diaphoresis. He reports mild shortness of breath but denies at this time. Chest pain is nonexertional. He has no cardiac history. He denies caffeine use or illicit drug use other than marijuana which she uses on a regular basis. Pt denies history of DVT, PE, recent surgery or trauma, malignancy, hemoptysis, unilateral leg swelling or tenderness, immobilization. Pt does not take warfarin and took for hip surgery for 1-2 weeks.    History reviewed. No pertinent past medical history. Past Surgical History  Procedure Laterality Date  . Hip surgery  03-2011    to left hip   History reviewed. No pertinent family history. History  Substance Use Topics  . Smoking status: Current Every Day Smoker  . Smokeless tobacco: Not on file  . Alcohol Use: No    Review of Systems 10 Systems reviewed and are negative for acute change except as noted in the HPI.    Allergies  Review of patient's allergies indicates no known allergies.  Home Medications   Prior to Admission medications   Medication Sig Start Date End Date Taking? Authorizing Provider  ibuprofen (ADVIL,MOTRIN) 200 MG tablet Take 600 mg by mouth every 6 (six) hours as needed for mild pain.   Yes Historical Provider, MD  methocarbamol (ROBAXIN) 500 MG tablet Take 500 mg by mouth 3 (three) times daily. For muscle spasms     Historical Provider, MD   oxyCODONE (OXY IR/ROXICODONE) 5 MG immediate release tablet Take 10 mg by mouth every 4 (four) hours as needed. For pain     Historical Provider, MD  warfarin (COUMADIN) 5 MG tablet Take 2.5-5 mg by mouth daily. Mon-Fri=1 tab, Sun and Sat=0.5 half tab     Historical Provider, MD   BP 120/98 mmHg  Pulse 97  Temp(Src) 97.8 F (36.6 C) (Oral)  Resp 20  SpO2 100% Physical Exam  Constitutional: He appears well-developed and well-nourished. No distress.  HENT:  Head: Normocephalic and atraumatic.  Eyes: Conjunctivae and EOM are normal. Right eye exhibits no discharge. Left eye exhibits no discharge.  Neck: No JVD present.  Cardiovascular: Normal rate and regular rhythm.   No leg swelling or tenderness. Negative Homan's sign.  Pulmonary/Chest: Effort normal and breath sounds normal. No respiratory distress. He has no wheezes.  Abdominal: Soft. Bowel sounds are normal. He exhibits no distension. There is no tenderness.  Neurological: He is alert. He exhibits normal muscle tone. Coordination normal.  Skin: Skin is warm and dry. He is not diaphoretic.  Nursing note and vitals reviewed.   ED Course  Procedures (including critical care time) Labs Review Labs Reviewed  CBC - Abnormal; Notable for the following:    RBC 6.32 (*)    Hemoglobin 12.3 (*)    MCV 62.5 (*)    MCH 19.5 (*)    RDW 16.4 (*)    All other components within normal limits  BASIC METABOLIC PANEL - Abnormal;  Notable for the following:    BUN <5 (*)    All other components within normal limits  I-STAT TROPOININ, ED    Imaging Review Dg Chest 2 View  09/24/2014   CLINICAL DATA:  Heart racing and left-sided chest pain for a few days. Smoker.  EXAM: CHEST  2 VIEW  COMPARISON:  CT chest 02/28/2011  FINDINGS: The heart size and mediastinal contours are within normal limits. Both lungs are clear. The visualized skeletal structures are unremarkable.  IMPRESSION: No active cardiopulmonary disease.   Electronically Signed   By:  Burman Nieves M.D.   On: 09/24/2014 22:25     EKG Interpretation   Date/Time:  Tuesday September 24 2014 22:11:21 EDT Ventricular Rate:  98 PR Interval:  145 QRS Duration: 85 QT Interval:  347 QTC Calculation: 443 R Axis:   65 Text Interpretation:  Sinus rhythm Similar to prior Confirmed by Gwendolyn Grant   MD, BLAIR ([id number]) on 09/24/2014 11:02:47 PM      MDM   Final diagnoses:  Chest pain, unspecified chest pain type  Palpitation   Chest pain is not likely of cardiac or pulmonary etiology d/t presentation, PERC negative, low risk HEART score. VSS, no JVD or new murmur, RRR, breath sounds equal bilaterally, EKG with sinus rhythm, negative troponin, and negative CXR. Pt's palpitations worse when he thinks about it. I suspect anxiety is playing a role. No chest pain in ED.  Patient without a PCP. I doubt PE, dissection, ACS. Patient to establish care and follow up. ED resources provided. Pt appears reliable for follow up and is agreeable to discharge.   Discussed return precautions with patient. Discussed all results and patient verbalizes understanding and agrees with plan.   Oswaldo Conroy, PA-C 09/25/14 0019  Elwin Mocha, MD 09/25/14 2252

## 2014-09-24 NOTE — ED Notes (Signed)
PA in the room talking to patient.  I will collect when they finish.

## 2014-09-24 NOTE — ED Notes (Signed)
Pt reports episodes of sharp chest pain followed by palpitations. Pt states this time it has been going on for the past couple of days. Pt denies n/v but reports he does become SOB.

## 2014-09-25 NOTE — Discharge Instructions (Signed)
Return to the emergency room with worsening of symptoms, new symptoms or with symptoms that are concerning , especially chest pain that feels like a pressure, spreads to left arm or jaw, worse with exertion, associated with nausea, vomiting, shortness of breath and/or sweating.  Stop using marijuana.  Follow up with wellness center. Call to make appointment. Read below information and follow recommendations. Palpitations A palpitation is the feeling that your heartbeat is irregular or is faster than normal. It may feel like your heart is fluttering or skipping a beat. Palpitations are usually not a serious problem. However, in some cases, you may need further medical evaluation. CAUSES  Palpitations can be caused by:  Smoking.  Caffeine or other stimulants, such as diet pills or energy drinks.  Alcohol.  Stress and anxiety.  Strenuous physical activity.  Fatigue.  Certain medicines.  Heart disease, especially if you have a history of irregular heart rhythms (arrhythmias), such as atrial fibrillation, atrial flutter, or supraventricular tachycardia.  An improperly working pacemaker or defibrillator. DIAGNOSIS  To find the cause of your palpitations, your health care provider will take your medical history and perform a physical exam. Your health care provider may also have you take a test called an ambulatory electrocardiogram (ECG). An ECG records your heartbeat patterns over a 24-hour period. You may also have other tests, such as:  Transthoracic echocardiogram (TTE). During echocardiography, sound waves are used to evaluate how blood flows through your heart.  Transesophageal echocardiogram (TEE).  Cardiac monitoring. This allows your health care provider to monitor your heart rate and rhythm in real time.  Holter monitor. This is a portable device that records your heartbeat and can help diagnose heart arrhythmias. It allows your health care provider to track your heart activity  for several days, if needed.  Stress tests by exercise or by giving medicine that makes the heart beat faster. TREATMENT  Treatment of palpitations depends on the cause of your symptoms and can vary greatly. Most cases of palpitations do not require any treatment other than time, relaxation, and monitoring your symptoms. Other causes, such as atrial fibrillation, atrial flutter, or supraventricular tachycardia, usually require further treatment. HOME CARE INSTRUCTIONS   Avoid:  Caffeinated coffee, tea, soft drinks, diet pills, and energy drinks.  Chocolate.  Alcohol.  Stop smoking if you smoke.  Reduce your stress and anxiety. Things that can help you relax include:  A method of controlling things in your body, such as your heartbeats, with your mind (biofeedback).  Yoga.  Meditation.  Physical activity such as swimming, jogging, or walking.  Get plenty of rest and sleep. SEEK MEDICAL CARE IF:   You continue to have a fast or irregular heartbeat beyond 24 hours.  Your palpitations occur more often. SEEK IMMEDIATE MEDICAL CARE IF:  You have chest pain or shortness of breath.  You have a severe headache.  You feel dizzy or you faint. MAKE SURE YOU:  Understand these instructions.  Will watch your condition.  Will get help right away if you are not doing well or get worse. Document Released: 06/04/2000 Document Revised: 06/12/2013 Document Reviewed: 08/06/2011 [facility] Patient Information 2015 [place], [place]. This information is not intended to replace advice given to you by your health care provider. Make sure you discuss any questions you have with your health care provider.
